# Patient Record
Sex: Male | Born: 1985 | Race: White | Hispanic: No | Marital: Single | Smoking: Never smoker
Health system: Southern US, Community
[De-identification: ages and names within clinical notes are randomized; demographics above are authoritative.]

## PROBLEM LIST (undated history)

## (undated) DIAGNOSIS — Z91018 Allergy to other foods: Secondary | ICD-10-CM

## (undated) DIAGNOSIS — J45909 Unspecified asthma, uncomplicated: Secondary | ICD-10-CM

## (undated) HISTORY — PX: APPENDECTOMY: SHX54

## (undated) HISTORY — DX: Unspecified asthma, uncomplicated: J45.909

## (undated) HISTORY — DX: Allergy to other foods: Z91.018

## (undated) HISTORY — PX: WISDOM TOOTH EXTRACTION: SHX21

---
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
## 2006-01-14 ENCOUNTER — Ambulatory Visit: Payer: Self-pay | Admitting: Psychologist

## 2006-01-21 ENCOUNTER — Ambulatory Visit: Payer: Self-pay | Admitting: Pediatrics

## 2006-02-11 ENCOUNTER — Ambulatory Visit: Payer: Self-pay | Admitting: Psychologist

## 2006-02-17 ENCOUNTER — Ambulatory Visit: Payer: Self-pay | Admitting: Psychologist

## 2006-06-26 ENCOUNTER — Ambulatory Visit: Payer: Self-pay | Admitting: Pediatrics

## 2006-10-19 ENCOUNTER — Ambulatory Visit: Payer: Self-pay | Admitting: Pediatrics

## 2007-02-09 ENCOUNTER — Ambulatory Visit: Payer: Self-pay | Admitting: Pediatrics

## 2007-07-02 ENCOUNTER — Ambulatory Visit: Payer: Self-pay | Admitting: Pediatrics

## 2007-10-19 ENCOUNTER — Ambulatory Visit: Payer: Self-pay | Admitting: Pediatrics

## 2008-02-23 ENCOUNTER — Ambulatory Visit: Payer: Self-pay | Admitting: Pediatrics

## 2008-06-01 ENCOUNTER — Ambulatory Visit: Payer: Self-pay | Admitting: Pediatrics

## 2008-08-17 ENCOUNTER — Ambulatory Visit: Payer: Self-pay | Admitting: Pediatrics

## 2008-11-14 ENCOUNTER — Ambulatory Visit: Payer: Self-pay | Admitting: Pediatrics

## 2009-02-21 ENCOUNTER — Ambulatory Visit: Payer: Self-pay | Admitting: Pediatrics

## 2009-05-24 ENCOUNTER — Ambulatory Visit: Payer: Self-pay | Admitting: Pediatrics

## 2009-07-02 ENCOUNTER — Encounter (INDEPENDENT_AMBULATORY_CARE_PROVIDER_SITE_OTHER): Payer: Self-pay | Admitting: Surgery

## 2009-07-02 ENCOUNTER — Ambulatory Visit (HOSPITAL_COMMUNITY): Admission: EM | Admit: 2009-07-02 | Discharge: 2009-07-02 | Payer: Self-pay | Admitting: Internal Medicine

## 2009-08-16 ENCOUNTER — Ambulatory Visit: Payer: Self-pay | Admitting: Pediatrics

## 2009-11-29 ENCOUNTER — Ambulatory Visit: Payer: Self-pay | Admitting: Pediatrics

## 2010-02-23 IMAGING — CT CT ABDOMEN W/ CM
1 series · 15 of 32 positions shown, 19 images · IV contrast (agent unspecified)
Comparison: None

CT ABDOMEN

CLINICAL DATA: Right lower quadrant pain.  Nausea.  Clinical
suspicion for appendicitis.

CT ABDOMEN AND PELVIS WITH CONTRAST
TECHNIQUE: Multidetector CT imaging of the abdomen and pelvis was
performed using the standard protocol following bolus
administration of intravenous contrast.
Contrast: 100 ml [id number] and oral contrast

[Series 2: rtn ap with st · axial · 0.63mm/px · z∈[-490,-70]mm · 15 of 93 slices shown, 19 images]
[im 6/93  soft-tissue]
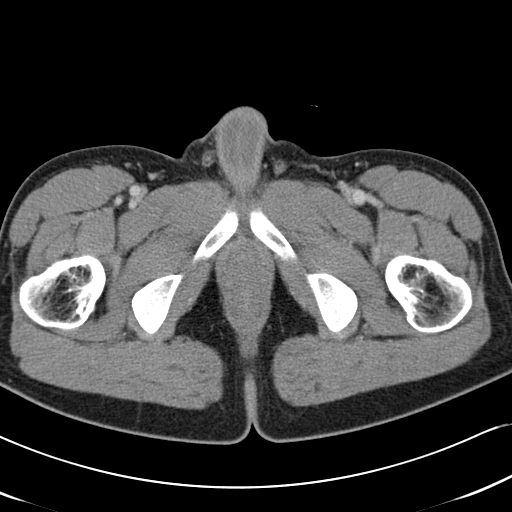
[im 6/93  bone]
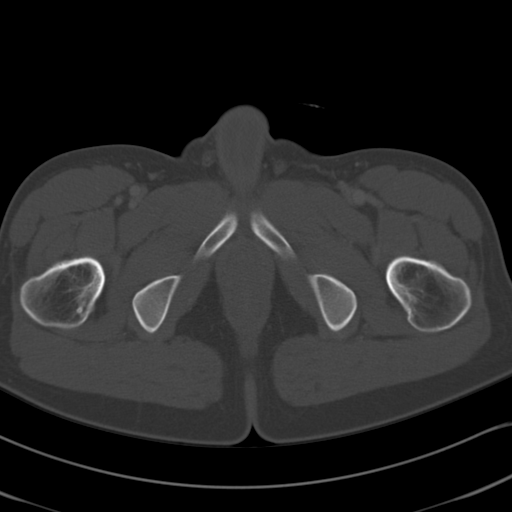
[im 12/93  soft-tissue]
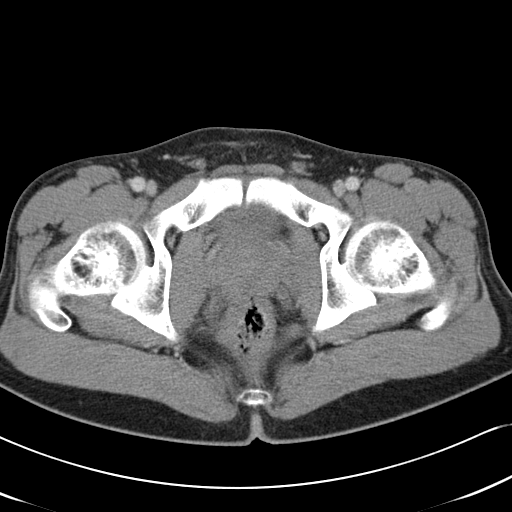
[im 18/93  soft-tissue]
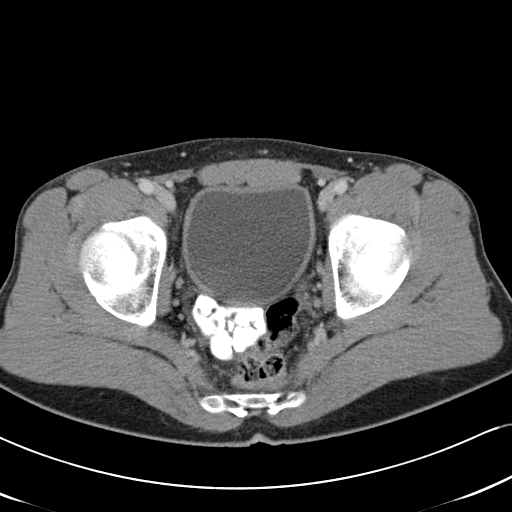
[im 27/93  soft-tissue]
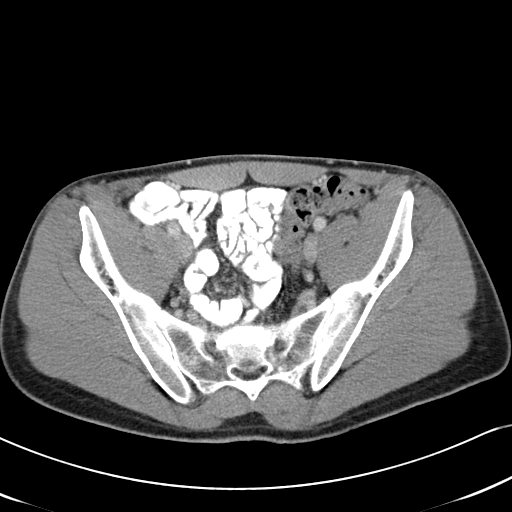
[im 33/93  soft-tissue]
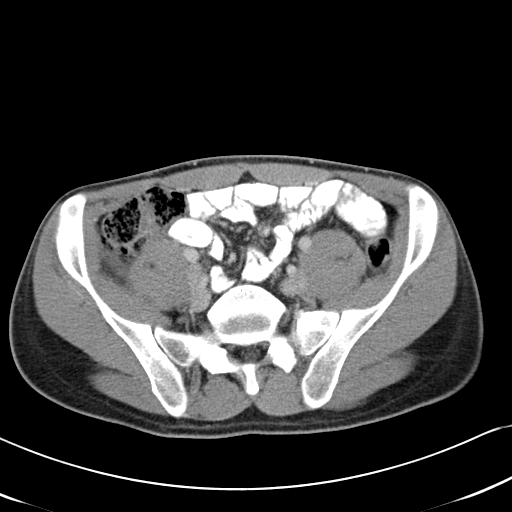
[im 39/93  soft-tissue]
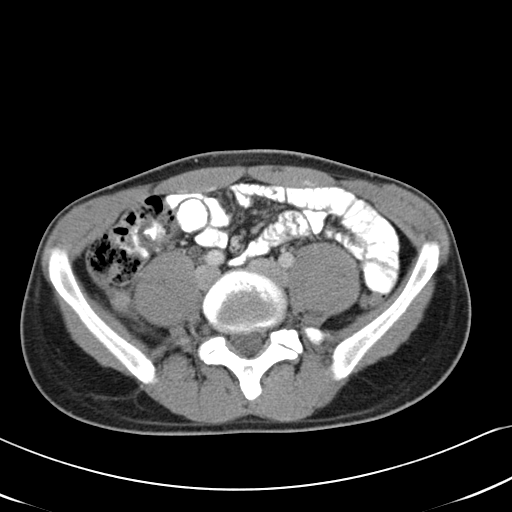
[im 48/93  soft-tissue]
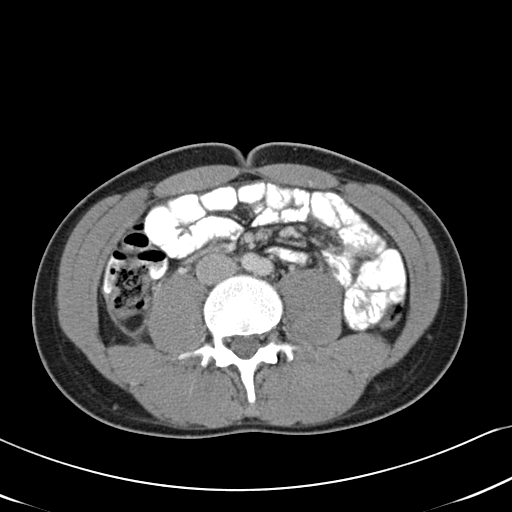
[im 54/93  soft-tissue]
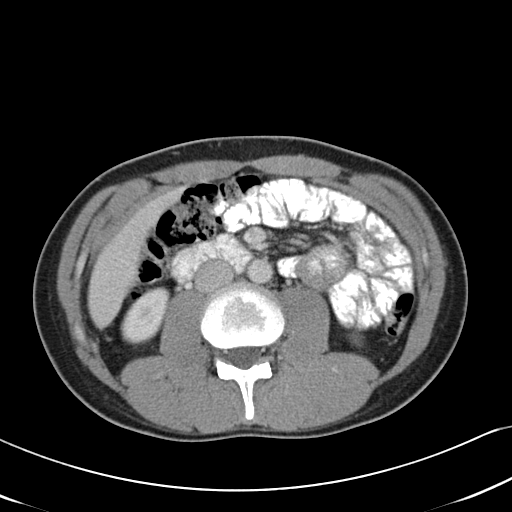
[im 60/93  soft-tissue]
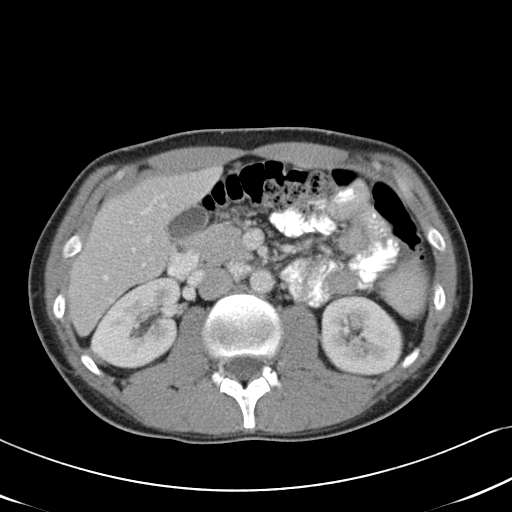
[im 60/93  bone]
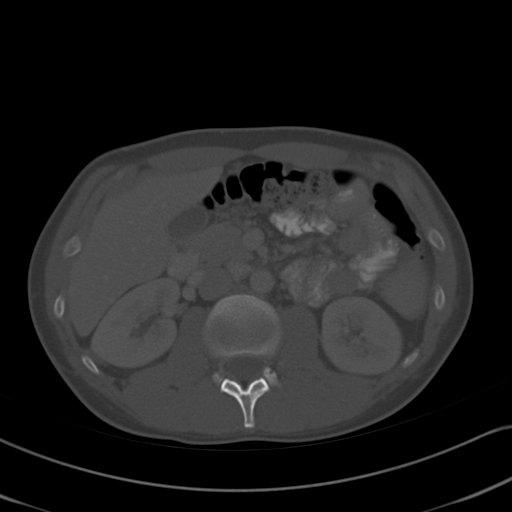
[im 66/93  soft-tissue]
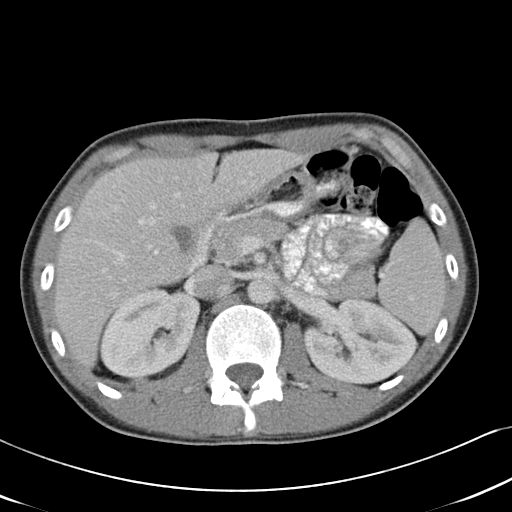
[im 75/93  soft-tissue]
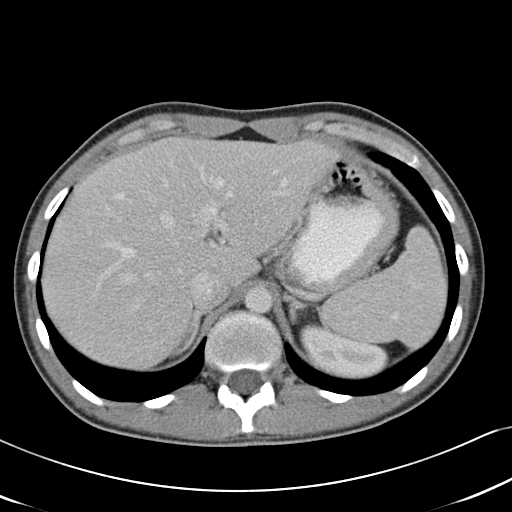
[im 81/93  soft-tissue]
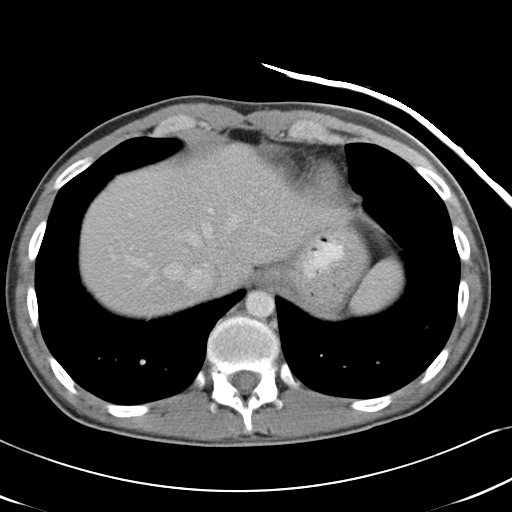
[im 81/93  lung]
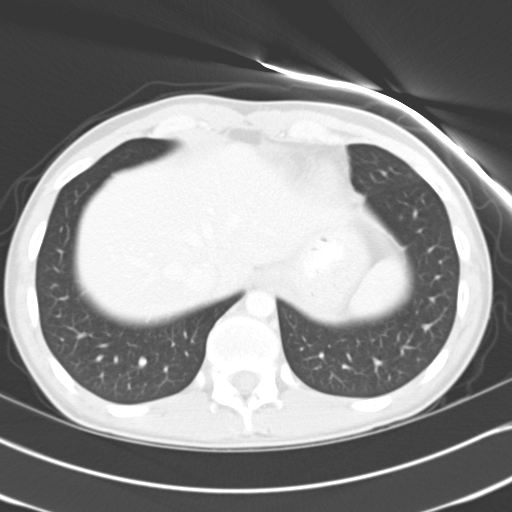
[im 84/93  lung]
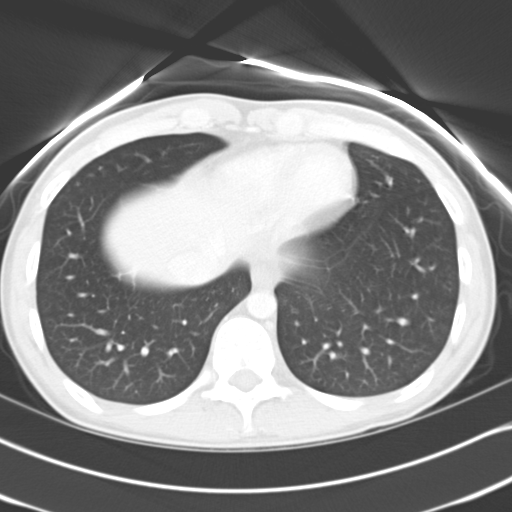
[im 87/93  soft-tissue]
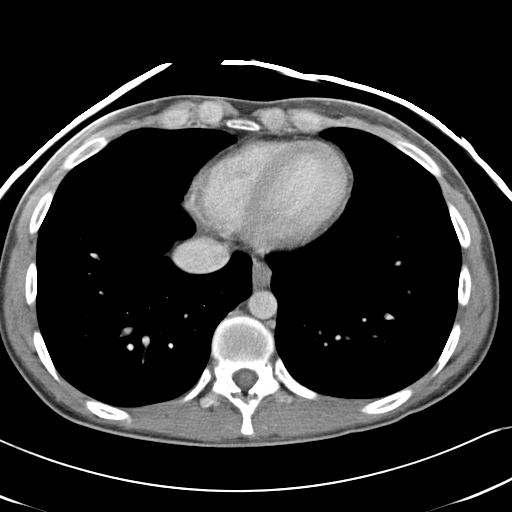
[im 87/93  lung]
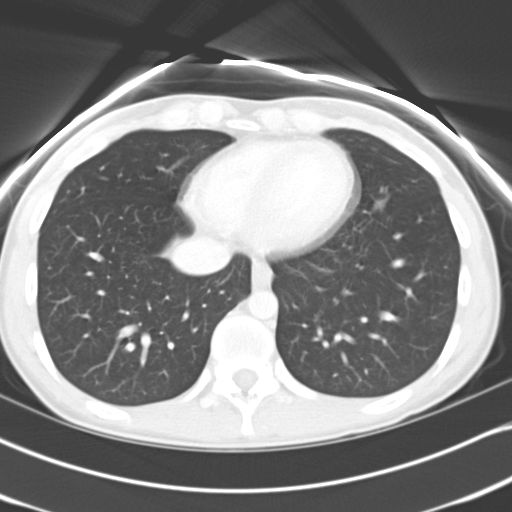
[im 90/93  lung]
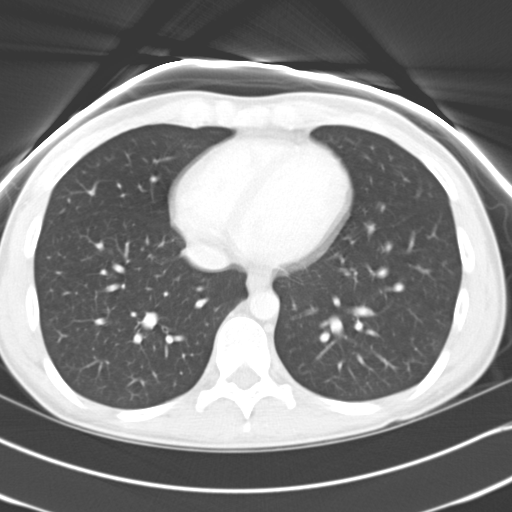

[15 of 32 positions shown; findings below may reference images not displayed]

FINDINGS: The abdominal parenchymal organs are normal in
appearance.  Gallbladder appears normal and there is no evidence of
hydronephrosis.

There is no evidence of soft tissue masses or lymphadenopathy
within the abdomen.  There is no evidence of inflammatory process
or abnormal fluid collections.  No evidence of dilated bowel loops.
IMPRESSION: No acute findings within the abdomen.

CT PELVIS
FINDINGS: The appendix is retrocecal location and shows diffuse
wall thickening and mild periappendiceal inflammatory changes.
This is consistent with acute retrocecal appendicitis.

There is no evidence of abscess or free fluid.  There is no
evidence of pelvic soft tissue masses or lymphadenopathy.
IMPRESSION: Acute retrocecal appendicitis.  No evidence of abscess or other
complication.

This was made a call report for [REDACTED]
technologist.

## 2015-08-28 ENCOUNTER — Other Ambulatory Visit: Payer: Self-pay | Admitting: *Deleted

## 2015-08-28 MED ORDER — MOMETASONE FUROATE 50 MCG/ACT NA SUSP: 2.0000 | Freq: Every day | NASAL | Status: DC

## 2015-08-29 ENCOUNTER — Other Ambulatory Visit: Payer: Self-pay | Admitting: Neurology

## 2015-08-29 ENCOUNTER — Telehealth: Payer: Self-pay | Admitting: Allergy and Immunology

## 2015-08-29 MED ORDER — FLUTICASONE PROPIONATE 50 MCG/ACT NA SUSP: NASAL | Status: DC

## 2015-08-29 NOTE — Telephone Encounter (Signed)
There are some PA's around for pt. He called insurance and they cover Fluticasone. He would like to try that in place of the nasonex.  i put a note on the PA Just documenting, no need to call

## 2015-08-29 NOTE — Telephone Encounter (Signed)
Per Dr Lucie LeatherKozlow sent in prescription for Fluticasone and called and left voicemail for patient.

## 2016-01-18 ENCOUNTER — Other Ambulatory Visit: Payer: Self-pay

## 2016-01-18 MED ORDER — MOMETASONE FUROATE 220 MCG/INH IN AEPB: 2.0000 | INHALATION_SPRAY | Freq: Every day | RESPIRATORY_TRACT | Status: DC

## 2016-02-05 ENCOUNTER — Ambulatory Visit (INDEPENDENT_AMBULATORY_CARE_PROVIDER_SITE_OTHER): Payer: 59 | Admitting: Allergy and Immunology

## 2016-02-05 ENCOUNTER — Encounter: Payer: Self-pay | Admitting: Allergy and Immunology

## 2016-02-05 VITALS — BP 122/62 | HR 76 | Resp 16 | Ht 71.0 in | Wt 140.0 lb

## 2016-02-05 DIAGNOSIS — Z91018 Allergy to other foods: Secondary | ICD-10-CM | POA: Diagnosis not present

## 2016-02-05 DIAGNOSIS — J309 Allergic rhinitis, unspecified: Secondary | ICD-10-CM

## 2016-02-05 DIAGNOSIS — H1010 Acute atopic conjunctivitis, unspecified eye: Secondary | ICD-10-CM

## 2016-02-05 DIAGNOSIS — J4530 Mild persistent asthma, uncomplicated: Secondary | ICD-10-CM | POA: Diagnosis not present

## 2016-02-05 MED ORDER — AZELASTINE HCL 0.05 % OP SOLN: 1.0000 [drp] | Freq: Two times a day (BID) | OPHTHALMIC | Status: DC

## 2016-02-05 MED ORDER — ALBUTEROL SULFATE HFA 108 (90 BASE) MCG/ACT IN AERS: 2.0000 | INHALATION_SPRAY | RESPIRATORY_TRACT | Status: DC | PRN

## 2016-02-05 MED ORDER — EPINEPHRINE 0.3 MG/0.3ML IJ SOAJ: 0.3000 mg | Freq: Once | INTRAMUSCULAR | Status: DC

## 2016-02-05 MED ORDER — FLUTICASONE PROPIONATE 50 MCG/ACT NA SUSP: NASAL | Status: DC

## 2016-02-05 MED ORDER — MOMETASONE FUROATE 220 MCG/INH IN AEPB: 2.0000 | INHALATION_SPRAY | Freq: Every day | RESPIRATORY_TRACT | Status: DC

## 2016-02-05 NOTE — Progress Notes (Signed)
Follow-up Note  Referring Provider: No ref. provider found Primary Provider: No PCP Per Patient Date of Office Visit: 02/05/2016  Subjective:   John Cannon (DOB: 1986-06-12) is a [age] y.o. male [facility] in re-evaluation of the following:  HPI: John MarshallJimmy returns to this clinic in reevaluation of his asthma and allergic rhinoconjunctivitis and food allergy. He is done quite well since His last visit in this clinic 1 year ago and has had no exacerbations of his asthma requiring a systemic steroid while consistently using his Asmanex. Rarely does he uses a short acting bronchodilator and he can exercise without any difficulty. He's had very little problems with his nose as well while using nasal fluticasone. He does need to use an occasional eyedrop and he does consistently use Zyrtec. He remains away from carrots and celery and apples and snap peas.    Medication List           CENTRUM ADULTS PO  Take 1 tablet by mouth daily.     cetirizine 10 MG tablet  Commonly known as:  ZYRTEC  Take 10 mg by mouth daily.     fluticasone 50 MCG/ACT nasal spray  Commonly known as:  FLONASE  Use 1-2 sprays in each nostril once daily .     METADATE CD 40 MG CR capsule  Generic drug:  methylphenidate  Take 80 mg by mouth daily.     mometasone 220 MCG/INH inhaler  Commonly known as:  ASMANEX 14 METERED DOSES  Inhale 2 puffs into the lungs daily.     VENTOLIN HFA 108 (90 Base) MCG/ACT inhaler  Generic drug:  albuterol  Inhale 2 puffs into the lungs every 4 (four) hours as needed for wheezing or shortness of breath.        Past Medical History  Diagnosis Date  . Asthma     Past Surgical History  Procedure Laterality Date  . Appendectomy    . Wisdom tooth extraction      Allergies  Allergen Reactions  . Benadryl [Diphenhydramine Hcl (Sleep)]     Hyperactivity   . Ceftin [Cefuroxime] Itching    Review of systems negative except  as noted in HPI / PMHx or noted below:  Review of Systems  Constitutional: Negative.   HENT: Negative.   Eyes: Negative.   Respiratory: Negative.   Cardiovascular: Negative.   Gastrointestinal: Negative.   Genitourinary: Negative.   Musculoskeletal: Negative.   Skin: Negative.   Neurological: Negative.   Endo/Heme/Allergies: Negative.   Psychiatric/Behavioral: Negative.      Objective:   Filed Vitals:   02/05/16 1723  BP: 122/62  Pulse: 76  Resp: 16   Height: 5\' 11"  (180.3 cm)  Weight: 140 lb (63.504 kg)   Physical Exam  Constitutional: He is well-developed, well-nourished, and in no distress.  HENT:  Head: Normocephalic.  Right Ear: Tympanic membrane, external ear and ear canal normal.  Left Ear: Tympanic membrane, external ear and ear canal normal.  Nose: Nose normal. No mucosal edema or rhinorrhea.  Mouth/Throat: Uvula is midline, oropharynx is clear and moist and mucous membranes are normal. No oropharyngeal exudate.  Eyes: Conjunctivae are normal.  Neck: Trachea normal. No tracheal tenderness present. No tracheal deviation present. No thyromegaly present.  Cardiovascular: Normal rate, regular rhythm, S1 normal, S2 normal and normal heart sounds.   No murmur heard. Pulmonary/Chest: Breath sounds normal. No stridor. No respiratory distress. He has no wheezes. He has  no rales.  Musculoskeletal: He exhibits no edema.  Lymphadenopathy:       Head (right side): No tonsillar adenopathy present.       Head (left side): No tonsillar adenopathy present.    He has no cervical adenopathy.  Neurological: He is alert. Gait normal.  Skin: No rash noted. He is not diaphoretic. No erythema. Nails show no clubbing.  Psychiatric: Mood and affect normal.    Diagnostics:    Spirometry was performed and demonstrated an FEV1 of 4.91 at 106 % of predicted.  The patient had an Asthma Control Test with the following results:  .    Assessment and Plan:   1. Asthma, well  controlled, mild persistent   2. Allergic rhinoconjunctivitis   3. Food allergy     1. Continue Asmanex 220 one inhalation 1 time per day  2. Continue nasal fluticasone one-2 sprays each nostril one time per day  3. Continue ProAir HFA 2 puffs every 4-6 hours if needed  4. Continue Azelastine eyedrop one drop each eye twice a day if needed  5. Continue Zyrtec 10 mg one tablet one time per day if needed  6. Auvi-Q 3.0 epinephrine autoinjector if needed. Specialty pharmacy  7. Return to clinic in 1 year or earlier if needed. Obtain fall flu vaccine  John Cannon has done quite well on his current medical therapy and I will continue to have him use nasal steroids and inhaled steroids to treat his atopic respiratory disease. He will use a Auvi-Q device if needed and we'll see him back in this clinic in 1 year or earlier if there is a problem.  Laurette Schimke, MD [facility]

## 2016-02-05 NOTE — Patient Instructions (Addendum)
  1. Continue Asmanex 220 one inhalation 1 time per day  2. Continue nasal fluticasone one-2 sprays each nostril one time per day  3. Continue ProAir HFA 2 puffs every 4-6 hours if needed  4. Continue Azelastine eyedrop one drop each eye twice a day if needed  5. Continue Zyrtec 10 mg one tablet one time per day if needed  6. Auvi-Q 3.0 epinephrine autoinjector if needed. Specialty pharmacy  7. Return to clinic in 1 year or earlier if needed. Obtain fall flu vaccine

## 2016-10-19 ENCOUNTER — Other Ambulatory Visit: Payer: Self-pay | Admitting: Allergy and Immunology

## 2016-10-20 ENCOUNTER — Other Ambulatory Visit: Payer: Self-pay | Admitting: Allergy and Immunology

## 2016-10-20 MED ORDER — FLUTICASONE PROPIONATE 50 MCG/ACT NA SUSP: NASAL | 2 refills | Status: DC

## 2016-10-20 NOTE — Telephone Encounter (Signed)
Patient wanted to know if you received a fax for a medication refill for the generic for Nasonex. They pharmacy told him they sent it to us.

## 2016-10-20 NOTE — Telephone Encounter (Signed)
Patient was requesting fluticasone nasal spray, not nasonex.

## 2017-01-01 ENCOUNTER — Telehealth: Payer: Self-pay | Admitting: Allergy and Immunology

## 2017-01-01 NOTE — Telephone Encounter (Signed)
Flovent and Pulmicort are the only options covered on his plan at this time.

## 2017-01-01 NOTE — Telephone Encounter (Signed)
Pt called and said pharmacy was suppose to send something to [place], but we have not received it so I called pt and he needs to have something else that take the place of asmanex because they requre a auth. And ins will not pay  [facility] in [place]. [phone].

## 2017-01-01 NOTE — Telephone Encounter (Signed)
What are his options?

## 2017-01-02 MED ORDER — BUDESONIDE 180 MCG/ACT IN AEPB: 2.0000 | INHALATION_SPRAY | Freq: Two times a day (BID) | RESPIRATORY_TRACT | 3 refills | Status: DC

## 2017-01-02 NOTE — Telephone Encounter (Signed)
Patient has been advised of medication being sent in.

## 2017-01-02 NOTE — Telephone Encounter (Signed)
Patient called again about this same message. He said he really needs something today because he is out of medication. He said the fastest option would be best.

## 2017-01-02 NOTE — Telephone Encounter (Signed)
Pulmicort 180 2 inhalations 1-2 times per day (2 should be fine but write twice a day) Inform patient.

## 2017-10-07 ENCOUNTER — Telehealth: Payer: Self-pay

## 2017-10-07 NOTE — Telephone Encounter (Signed)
Completed.

## 2017-10-13 ENCOUNTER — Telehealth: Payer: Self-pay | Admitting: Allergy and Immunology

## 2017-10-13 MED ORDER — BUDESONIDE 180 MCG/ACT IN AEPB: 2.0000 | INHALATION_SPRAY | Freq: Two times a day (BID) | RESPIRATORY_TRACT | 0 refills | Status: DC

## 2017-10-13 NOTE — Telephone Encounter (Signed)
Patient is requesting a refill for Pulmicort. He was told he needed an appt and he has one scheduled for 11-11-17. He said his insurance has changed and they will not pay for this. But he is requesting it.

## 2017-10-13 NOTE — Telephone Encounter (Signed)
Prescription sent

## 2017-10-13 NOTE — Telephone Encounter (Signed)
Pharmacy is [facility] in [place], on [address].

## 2017-10-15 NOTE — Telephone Encounter (Signed)
Patient's insurance will not cover the Pulmicort. The alternative is Qvar. Please advise and thank you.

## 2017-10-15 NOTE — Telephone Encounter (Signed)
So this is the message I received this week: "Flovent and Pulmicort are the only options covered on his plan at this time."  So now Pulmicort is not covered and Qvar is? ARE WE SURE ABOUT THIS???????  If Qvar, use Qvar 80 Redihaler 2 inhalations one time per day.

## 2017-10-15 NOTE — Telephone Encounter (Signed)
Please advise and thank you. 

## 2017-10-15 NOTE — Telephone Encounter (Signed)
Patient called back checking on this. I told him Pulmicort had been sent to pharmacy. He said his insurance won't pay for this and he needs an equivalent to this that [employer] will cover.

## 2017-10-15 NOTE — Telephone Encounter (Signed)
Can you please check on this?

## 2017-10-16 MED ORDER — BECLOMETHASONE DIPROP HFA 80 MCG/ACT IN AERB: 2.0000 | INHALATION_SPRAY | Freq: Every day | RESPIRATORY_TRACT | 5 refills | Status: DC

## 2017-10-16 NOTE — Addendum Note (Signed)
Addended by: Mliss FritzBLACK, Ryver Poblete I on: 10/16/2017 03:44 PM   Modules accepted: Orders

## 2017-10-16 NOTE — Telephone Encounter (Signed)
Prescription has been sent to pharmacy as instructed by Dr. Lucie LeatherKozlow. I did try to contact patient to notify him of this but he did not answer and did not have a voicemail set up.

## 2017-10-16 NOTE — Telephone Encounter (Signed)
I spoke with the pharmacist and he advised that the Qvar is preferred with this patient's insurance

## 2017-11-11 ENCOUNTER — Ambulatory Visit (INDEPENDENT_AMBULATORY_CARE_PROVIDER_SITE_OTHER): Payer: Managed Care, Other (non HMO) | Admitting: Allergy and Immunology

## 2017-11-11 ENCOUNTER — Encounter: Payer: Self-pay | Admitting: Allergy and Immunology

## 2017-11-11 VITALS — BP 118/68 | HR 72 | Resp 20

## 2017-11-11 DIAGNOSIS — Z91018 Allergy to other foods: Secondary | ICD-10-CM

## 2017-11-11 DIAGNOSIS — J4530 Mild persistent asthma, uncomplicated: Secondary | ICD-10-CM | POA: Diagnosis not present

## 2017-11-11 DIAGNOSIS — J3089 Other allergic rhinitis: Secondary | ICD-10-CM | POA: Diagnosis not present

## 2017-11-11 MED ORDER — AUVI-Q 0.3 MG/0.3ML IJ SOAJ: INTRAMUSCULAR | 2 refills | Status: DC

## 2017-11-11 MED ORDER — AZELASTINE HCL 0.05 % OP SOLN: OPHTHALMIC | 3 refills | Status: DC

## 2017-11-11 NOTE — Progress Notes (Signed)
Follow-up Note  Referring Provider: Kathi DerSiddiqui, Adeel M, MD Primary Provider: Kathi DerSiddiqui, Adeel M, MD Date of Office Visit: 11/11/2017  Subjective:   John Cannon (DOB: [birth year]) is a 32 y.o. male who [facility] in re-evaluation of the following:  HPI: John Cannon returns to this clinic in reevaluation of asthma and allergic rhinitis and food allergy.  His last visit to this clinic was 05 Feb 2016.  He has had an excellent year without the need for systemic steroid or antibiotic although in January he did have an upper respiratory tract infection for which he was given doxycycline which resulted in significant GI upset for which he is now using Prilosec.  Fortunately, most of his GI issue has resolved.  John Cannon can exercise without any difficulty and rarely uses a short acting bronchodilator while consistently using Qvar.  Presently he is using an extremely low dose at only 1 inhalation once a day.  His nose is doing very well while using a nasal steroid.  He remains away from consumption of carrots and celery and apples and snap peas and bananas.  He did obtain the flu vaccine this year.  Allergies as of 11/11/2017      Reactions   Benadryl [diphenhydramine Hcl (sleep)]    Hyperactivity   Ceftin [cefuroxime] Itching   Doxycycline Other (See Comments)   Stomach issues " undiagnosed stomach ulcer"      Medication List      albuterol 108 (90 Base) MCG/ACT inhaler Commonly known as:  VENTOLIN HFA Inhale 2 puffs into the lungs every 4 (four) hours as needed for wheezing or shortness of breath.   azelastine 0.05 % ophthalmic solution Commonly known as:  OPTIVAR Place 1 drop into both eyes 2 (two) times daily.   beclomethasone 80 MCG/ACT inhaler Commonly known as:  QVAR REDIHALER Inhale 2 puffs into the lungs daily.   CENTRUM ADULTS PO Take 1 tablet by mouth daily.   cetirizine 10 MG tablet Commonly known as:  ZYRTEC Take 10 mg by  mouth daily.   EPINEPHrine 0.3 mg/0.3 mL Soaj injection Commonly known as:  AUVI-Q Inject 0.3 mLs (0.3 mg total) into the muscle once.   fluticasone 50 MCG/ACT nasal spray Commonly known as:  FLONASE Use one-two sprays in each nostril once daily as directed   METADATE CD 40 MG CR capsule Generic drug:  methylphenidate Take 80 mg by mouth daily.       Past Medical History:  Diagnosis Date  . Asthma   . Food allergy    Banana, snap peas, carrots, celery, apples    Past Surgical History:  Procedure Laterality Date  . APPENDECTOMY    . WISDOM TOOTH EXTRACTION      Review of systems negative except as noted in HPI / PMHx or noted below:  Review of Systems  Constitutional: Negative.   HENT: Negative.   Eyes: Negative.   Respiratory: Negative.   Cardiovascular: Negative.   Gastrointestinal: Negative.   Genitourinary: Negative.   Musculoskeletal: Negative.   Skin: Negative.   Neurological: Negative.   Endo/Heme/Allergies: Negative.   Psychiatric/Behavioral: Negative.      Objective:   Vitals:   11/11/17 1040  BP: 118/68  Pulse: 72  Resp: 20          Physical Exam  Constitutional: He is well-developed, well-nourished, and in no distress.  HENT:  Head: Normocephalic.  Right Ear: Tympanic membrane, external ear and ear canal normal.  Left Ear: Tympanic membrane, external ear and ear canal normal.  Nose: Nose normal. No mucosal edema or rhinorrhea.  Mouth/Throat: Uvula is midline, oropharynx is clear and moist and mucous membranes are normal. No oropharyngeal exudate.  Eyes: Conjunctivae are normal.  Neck: Trachea normal. No tracheal tenderness present. No tracheal deviation present. No thyromegaly present.  Cardiovascular: Normal rate, regular rhythm, S1 normal, S2 normal and normal heart sounds.  No murmur heard. Pulmonary/Chest: Breath sounds normal. No stridor. No respiratory distress. He has no wheezes. He has no rales.  Musculoskeletal: He exhibits no  edema.  Lymphadenopathy:       Head (right side): No tonsillar adenopathy present.       Head (left side): No tonsillar adenopathy present.    He has no cervical adenopathy.  Neurological: He is alert. Gait normal.  Skin: No rash noted. He is not diaphoretic. No erythema. Nails show no clubbing.  Psychiatric: Mood and affect normal.    Diagnostics:    Spirometry was performed and demonstrated an FEV1 of 4.86 at 106 % of predicted.  The patient had an Asthma Control Test with the following results: ACT Total Score: 24.    Assessment and Plan:   1. Asthma, well controlled, mild persistent   2. Other allergic rhinitis   3. Food allergy     1. Continue Qvar 80 redihaler one inhalation 1 time per day. Can increase to 2 inhalations two times per day if 'flare up'  2. Continue nasal fluticasone one-2 sprays each nostril one time per day  3. Continue ProAir HFA 2 puffs every 4-6 hours if needed  4. Continue Azelastine eyedrop one drop each eye twice a day if needed   5. Continue Zyrtec 10 mg one tablet one time per day if needed  6. Auvi-Q 3.0 epinephrine autoinjector if needed. Specialty pharmacy  7. Return to clinic in 1 year or earlier if needed.    John Marshall appears to be doing very well at this point in time and I will see him back in this clinic in a 1 year or earlier if there is a problem.  Laurette Schimke, MD Allergy / Immunology [facility]

## 2017-11-11 NOTE — Patient Instructions (Addendum)
  1. Continue Qvar 80 redihaler one inhalation 1 time per day. Can increase to 2 inhalations two times per day if 'flare up'  2. Continue nasal fluticasone one-2 sprays each nostril one time per day  3. Continue ProAir HFA 2 puffs every 4-6 hours if needed  4. Continue Azelastine eyedrop one drop each eye twice a day if needed  5. Continue Zyrtec 10 mg one tablet one time per day if needed  6. Auvi-Q 3.0 epinephrine autoinjector if needed. Specialty pharmacy  7. Return to clinic in 1 year or earlier if needed.   

## 2017-11-12 ENCOUNTER — Encounter: Payer: Self-pay | Admitting: Allergy and Immunology

## 2018-09-06 ENCOUNTER — Telehealth: Payer: Self-pay | Admitting: Allergy and Immunology

## 2018-09-06 MED ORDER — ALBUTEROL SULFATE HFA 108 (90 BASE) MCG/ACT IN AERS: 2.0000 | INHALATION_SPRAY | Freq: Four times a day (QID) | RESPIRATORY_TRACT | 2 refills | Status: DC | PRN

## 2018-09-06 MED ORDER — ALBUTEROL SULFATE HFA 108 (90 BASE) MCG/ACT IN AERS: 2.0000 | INHALATION_SPRAY | RESPIRATORY_TRACT | 1 refills | Status: DC | PRN

## 2018-09-06 NOTE — Telephone Encounter (Signed)
Pt called and needs to have the albuterol inhaler called into [facility] on [address] in [place]. [phone].

## 2018-09-06 NOTE — Telephone Encounter (Signed)
Pt called and said his [employer] ins is not going to cover the inhaler and wanted to see if you could call in something that his ins will cover.

## 2018-09-06 NOTE — Telephone Encounter (Signed)
Resent albuterol for either Proventil or Ventolin

## 2018-09-06 NOTE — Telephone Encounter (Signed)
Refill has been sent in to the requested pharmacy 

## 2018-11-24 ENCOUNTER — Other Ambulatory Visit: Payer: Self-pay | Admitting: Allergy and Immunology

## 2018-11-30 ENCOUNTER — Telehealth: Payer: Self-pay

## 2018-11-30 MED ORDER — AUVI-Q 0.3 MG/0.3ML IJ SOAJ: INTRAMUSCULAR | 2 refills | Status: DC

## 2018-11-30 MED ORDER — AZELASTINE HCL 0.05 % OP SOLN: OPHTHALMIC | 2 refills | Status: DC

## 2018-11-30 MED ORDER — FLUTICASONE PROPIONATE 50 MCG/ACT NA SUSP: NASAL | 2 refills | Status: DC

## 2018-11-30 MED ORDER — BECLOMETHASONE DIPROP HFA 80 MCG/ACT IN AERB: INHALATION_SPRAY | RESPIRATORY_TRACT | 2 refills | Status: DC

## 2018-11-30 MED ORDER — ALBUTEROL SULFATE HFA 108 (90 BASE) MCG/ACT IN AERS: 2.0000 | INHALATION_SPRAY | Freq: Four times a day (QID) | RESPIRATORY_TRACT | 1 refills | Status: DC | PRN

## 2018-11-30 NOTE — Telephone Encounter (Signed)
Medications have been sent in to get the patient to her appointment in May. Called and informed patient, pt verbalized understanding.

## 2018-11-30 NOTE — Telephone Encounter (Signed)
Patient has r/s his appt with Dr Lucie Leather and was wondering if we can send refills in on all his meds to [facility] on [address] [place]  [place] [address]

## 2018-12-07 ENCOUNTER — Ambulatory Visit: Payer: Managed Care, Other (non HMO) | Admitting: Allergy and Immunology

## 2019-01-11 ENCOUNTER — Ambulatory Visit (INDEPENDENT_AMBULATORY_CARE_PROVIDER_SITE_OTHER): Payer: Managed Care, Other (non HMO) | Admitting: Allergy and Immunology

## 2019-01-11 ENCOUNTER — Encounter: Payer: Self-pay | Admitting: Allergy and Immunology

## 2019-01-11 ENCOUNTER — Other Ambulatory Visit: Payer: Self-pay

## 2019-01-11 DIAGNOSIS — Z91018 Allergy to other foods: Secondary | ICD-10-CM | POA: Diagnosis not present

## 2019-01-11 DIAGNOSIS — J3089 Other allergic rhinitis: Secondary | ICD-10-CM

## 2019-01-11 DIAGNOSIS — J4530 Mild persistent asthma, uncomplicated: Secondary | ICD-10-CM | POA: Diagnosis not present

## 2019-01-11 MED ORDER — FLUTICASONE PROPIONATE 50 MCG/ACT NA SUSP: NASAL | 5 refills | Status: DC

## 2019-01-11 MED ORDER — CETIRIZINE HCL 10 MG PO TABS: 10.0000 mg | ORAL_TABLET | Freq: Every day | ORAL | 5 refills | Status: DC

## 2019-01-11 MED ORDER — ALBUTEROL SULFATE HFA 108 (90 BASE) MCG/ACT IN AERS: 2.0000 | INHALATION_SPRAY | Freq: Four times a day (QID) | RESPIRATORY_TRACT | 1 refills | Status: DC | PRN

## 2019-01-11 MED ORDER — BECLOMETHASONE DIPROP HFA 80 MCG/ACT IN AERB: INHALATION_SPRAY | RESPIRATORY_TRACT | 5 refills | Status: DC

## 2019-01-11 MED ORDER — AUVI-Q 0.3 MG/0.3ML IJ SOAJ: INTRAMUSCULAR | 2 refills | Status: DC

## 2019-01-11 MED ORDER — AZELASTINE HCL 0.05 % OP SOLN: OPHTHALMIC | 5 refills | Status: DC

## 2019-01-11 NOTE — Progress Notes (Signed)
Patient is under quarantine at [employer] in [place], [place]. Provider is in office. Verbal Consent given. Start Time: 1002 am

## 2019-01-11 NOTE — Patient Instructions (Signed)
  1. Continue Qvar 80 redihaler one inhalation 1 time per day. Can increase to 2 inhalations two times per day if 'flare up'  2. Continue nasal fluticasone one-2 sprays each nostril one time per day  3. Continue ProAir HFA 2 puffs every 4-6 hours if needed  4. Continue Azelastine eyedrop one drop each eye twice a day if needed  5. Continue Zyrtec 10 mg one tablet one time per day if needed  6. Auvi-Q 3.0 epinephrine autoinjector if needed. Specialty pharmacy  7. Return to clinic in 1 year or earlier if needed.

## 2019-01-11 NOTE — Progress Notes (Signed)
[place] [facility] [place] [facility] [place] [facility] [place] - [place]   Follow-up Note  Referring Provider: Kathi Der, MD Primary Provider: Kathi Der, MD Date of Office Visit: 01/11/2019  Subjective:   John Cannon (DOB: 05/25/1986) is a 33 y.o. male who [facility] 01[facility]2019 in re-evaluation of the following:  HPI: This is an E-med visit requested by patient who is located at home.  John Cannon is followed in this clinic for asthma and allergic rhinitis and food allergy.  His last visit to this clinic was 11 November 2017.  No systemic steroid use or antibiotics use other than a Urgent Care evaluation for a URTI treated with Z pack in December 2020. Otherwise, doing great, rare use of SABA, can exercise without difficulty while using Qvar at a dose of 1 inhalation daily.  Nose OK even through the spring while using flonase.  Avoiding raw vegetables and fruits. Has Auvi-Q.   Did receive flu vaccine.  Allergies as of 01/11/2019      Reactions   Benadryl [diphenhydramine Hcl (sleep)]    Hyperactivity   Ceftin [cefuroxime] Itching   Doxycycline Other (See Comments)   Stomach issues " undiagnosed stomach ulcer"      Medication List      albuterol 108 (90 Base) MCG/ACT inhaler Commonly known as:  VENTOLIN HFA Inhale 2 puffs into the lungs every 6 (six) hours as needed for wheezing or shortness of breath.   Auvi-Q 0.3 mg/0.3 mL Soaj injection Generic drug:  EPINEPHrine Use as directed for life-threatening allergic reaction.   azelastine 0.05 % ophthalmic solution Commonly known as:  OPTIVAR Can use one drop in each eye twice daily if needed.   beclomethasone 80 MCG/ACT inhaler Commonly known as:  Qvar RediHaler INHALE 2 PUFFS BY MOUTH DAILY   CENTRUM ADULTS PO Take 1 tablet by mouth daily.   cetirizine 10 MG tablet Commonly known as:  ZYRTEC Take 10 mg by mouth daily.   fluticasone 50 MCG/ACT nasal spray Commonly known as:  FLONASE  Use one-two sprays in each nostril once daily as directed   Metadate CD 40 MG CR capsule Generic drug:  methylphenidate Take 80 mg by mouth daily.       Past Medical History:  Diagnosis Date  . Asthma   . Food allergy    Banana, snap peas, carrots, celery, apples    Past Surgical History:  Procedure Laterality Date  . APPENDECTOMY    . WISDOM TOOTH EXTRACTION      Review of systems negative except as noted in HPI / PMHx or noted below:  Review of Systems  Constitutional: Negative.   HENT: Negative.   Eyes: Negative.   Respiratory: Negative.   Cardiovascular: Negative.   Gastrointestinal: Negative.   Genitourinary: Negative.   Musculoskeletal: Negative.   Skin: Negative.   Neurological: Negative.   Endo/Heme/Allergies: Negative.   Psychiatric/Behavioral: Negative.      Objective:   There were no vitals filed for this visit.        Physical Exam-deferred  Diagnostics: none  Assessment and Plan:   1. Asthma, well controlled, mild persistent   2. Other allergic rhinitis   3. Food allergy     1. Continue Qvar 80 redihaler one inhalation 1 time per day. Can increase to 2 inhalations two times per day if 'flare up'  2. Continue nasal fluticasone one-2 sprays each nostril one time per day  3. Continue ProAir HFA 2 puffs  every 4-6 hours if needed  4. Continue Azelastine eyedrop one drop each eye twice a day if needed  5. Continue Zyrtec 10 mg one tablet one time per day if needed  6. Auvi-Q 3.0 epinephrine autoinjector if needed. Specialty pharmacy  7. Return to clinic in 1 year or earlier if needed.    John Cannon has had another excellent year on his current medical therapy.  He is very knowledgeable about his disease state and how his medications work and appropriate dosing of his medications.  We will refill all of his medications and I will see him back in this clinic in 1 year or earlier if there is a problem..  Total patient interaction time 15 minutes   Laurette SchimkeEric Jassica Zazueta, MD Allergy / Immunology [facility]

## 2019-01-12 ENCOUNTER — Encounter: Payer: Self-pay | Admitting: Allergy and Immunology

## 2019-01-12 NOTE — Progress Notes (Signed)
Patient is at home. Provider is in office.  Verbal Consent given.  Start Time: 1002 am End Time: 1127 am

## 2019-11-22 ENCOUNTER — Telehealth: Payer: Self-pay | Admitting: Allergy and Immunology

## 2019-11-22 MED ORDER — AZELASTINE HCL 0.05 % OP SOLN: OPHTHALMIC | 0 refills | Status: DC

## 2019-11-22 MED ORDER — QVAR REDIHALER 80 MCG/ACT IN AERB: INHALATION_SPRAY | RESPIRATORY_TRACT | 0 refills | Status: DC

## 2019-11-22 MED ORDER — ALBUTEROL SULFATE HFA 108 (90 BASE) MCG/ACT IN AERS: 2.0000 | INHALATION_SPRAY | Freq: Four times a day (QID) | RESPIRATORY_TRACT | 0 refills | Status: DC | PRN

## 2019-11-22 NOTE — Telephone Encounter (Signed)
Patient called and is in the process changing jobs and his ins will end this month and started back in May. I may him appointment for May 4 but he needs refill now optivar, albuterol, and needs a 3 month suppy of Qvar because that is the only way his ins will pay. [facility] [place] [phone]

## 2019-11-22 NOTE — Telephone Encounter (Signed)
Call to patient to make him aware that prescriptions were called in.  Pt verbalized understanding, call ended.

## 2020-01-10 ENCOUNTER — Ambulatory Visit: Payer: Managed Care, Other (non HMO) | Admitting: Allergy and Immunology

## 2020-01-10 ENCOUNTER — Other Ambulatory Visit: Payer: Self-pay

## 2020-01-10 ENCOUNTER — Encounter: Payer: Self-pay | Admitting: Allergy and Immunology

## 2020-01-10 VITALS — BP 110/62 | HR 69 | Temp 98.0°F | Resp 16 | Ht 71.0 in | Wt 147.2 lb

## 2020-01-10 DIAGNOSIS — J4530 Mild persistent asthma, uncomplicated: Secondary | ICD-10-CM

## 2020-01-10 DIAGNOSIS — Z91018 Allergy to other foods: Secondary | ICD-10-CM

## 2020-01-10 DIAGNOSIS — J3089 Other allergic rhinitis: Secondary | ICD-10-CM | POA: Diagnosis not present

## 2020-01-10 MED ORDER — AUVI-Q 0.3 MG/0.3ML IJ SOAJ: INTRAMUSCULAR | 1 refills | Status: DC

## 2020-01-10 NOTE — Progress Notes (Signed)
[place] - [place] - [place] - [place] - [place]   Follow-up Note  Referring Provider: Kathi Der, MD Primary Provider: Kathi Der, MD Date of Office Visit: 01/10/2020  Subjective:   John Cannon (DOB: Jan 20, 1986) is a 34 y.o. male who [facility] in re-evaluation of the following:  HPI: John Cannon returns to this clinic in reevaluation of asthma and allergic rhinitis and food allergy directed against various fruits and vegetables.  His last contact with this clinic was via a E-med visit on 11 Jan 2019.  He is really doing well from an asthmatic standpoint and has not had activate his action plan and rarely uses a short acting bronchodilator where he continues on a very low dose of Qvar.  He can exercise without any problem.  His nose has not really been causing him any issues while he continues to use Flonase.  His oral allergy syndrome and food allergy appears to be inactive as long as he remains away from raw fruits and vegetables.  Allergies as of 01/10/2020      Reactions   Benadryl [diphenhydramine Hcl (sleep)]    Hyperactivity   Ceftin [cefuroxime] Itching   Doxycycline Other (See Comments)   Stomach issues " undiagnosed stomach ulcer"      Medication List      albuterol 108 (90 Base) MCG/ACT inhaler Commonly known as: VENTOLIN HFA Inhale 2 puffs into the lungs every 6 (six) hours as needed.   Auvi-Q 0.3 mg/0.3 mL Soaj injection Generic drug: EPINEPHrine Use as directed for life-threatening allergic reaction.   azelastine 0.05 % ophthalmic solution Commonly known as: OPTIVAR Can use one drop in each eye twice daily if needed.   CENTRUM ADULTS PO Take 1 tablet by mouth daily.   cetirizine 10 MG tablet Commonly known as: ZYRTEC Take 1 tablet (10 mg total) by mouth daily.   fluticasone 50 MCG/ACT nasal spray Commonly known as: FLONASE Use one-two sprays in each nostril once daily as directed    Metadate CD 40 MG CR capsule Generic drug: methylphenidate Take 80 mg by mouth daily.   Qvar RediHaler 80 MCG/ACT inhaler Generic drug: beclomethasone INHALE 2 PUFFS BY MOUTH DAILY       Past Medical History:  Diagnosis Date  . Asthma   . Food allergy    Banana, snap peas, carrots, celery, apples    Past Surgical History:  Procedure Laterality Date  . APPENDECTOMY    . WISDOM TOOTH EXTRACTION      Review of systems negative except as noted in HPI / PMHx or noted below:  Review of Systems  Constitutional: Negative.   HENT: Negative.   Eyes: Negative.   Respiratory: Negative.   Cardiovascular: Negative.   Gastrointestinal: Negative.   Genitourinary: Negative.   Musculoskeletal: Negative.   Skin: Negative.   Neurological: Negative.   Endo/Heme/Allergies: Negative.   Psychiatric/Behavioral: Negative.      Objective:   Vitals:   01/10/20 1610  BP: 110/62  Pulse: 69  Resp: 16  Temp: 98 F (36.7 C)  SpO2: 98%   Height: 5\' 11"  (180.3 cm)  Weight: 147 lb 3.2 oz (66.8 kg)   Physical Exam Constitutional:      Appearance: He is not diaphoretic.  HENT:     Head: Normocephalic.     Right Ear: Tympanic membrane, ear canal and external ear normal.     Left Ear: Tympanic membrane, ear canal and external ear normal.  Nose: Nose normal. No mucosal edema or rhinorrhea.     Mouth/Throat:     Pharynx: Uvula midline. No oropharyngeal exudate.  Eyes:     Conjunctiva/sclera: Conjunctivae normal.  Neck:     Thyroid: No thyromegaly.     Trachea: Trachea normal. No tracheal tenderness or tracheal deviation.  Cardiovascular:     Rate and Rhythm: Normal rate and regular rhythm.     Heart sounds: Normal heart sounds, S1 normal and S2 normal. No murmur.  Pulmonary:     Effort: No respiratory distress.     Breath sounds: Normal breath sounds. No stridor. No wheezing or rales.  Lymphadenopathy:     Head:     Right side of head: No tonsillar adenopathy.     Left side  of head: No tonsillar adenopathy.     Cervical: No cervical adenopathy.  Skin:    Findings: No erythema or rash.     Nails: There is no clubbing.  Neurological:     Mental Status: He is alert.     Diagnostics:    Spirometry was performed and demonstrated an FEV1 of 5.10 at 113 % of predicted.  The patient had an Asthma Control Test with the following results: ACT Total Score: 24.    Assessment and Plan:   1. Asthma, well controlled, mild persistent   2. Other allergic rhinitis   3. Food allergy     1. Continue Qvar 80 redihaler one inhalation 1 time per day.   2. Continue nasal fluticasone 1-2 sprays each nostril one time per day  3. Continue ProAir HFA 2 puffs every 4-6 hours if needed  4. Continue Azelastine eyedrop one drop each eye twice a day if needed  5. Continue Zyrtec 10 mg one tablet one time per day if needed  6. Auvi-Q 3.0 epinephrine autoinjector if needed.    7. Can increase Qvar to 2 inhalations 2 times per day if 'flare up'  8. Return to clinic in 1 year or earlier if needed.    Laverna Peace appears to be doing quite well with minimal amounts of anti-inflammatory medications for both his upper and lower airway utilized on a consistent basis to prevent him from developing problems with his atopic respiratory disease.  Assuming he does well on this plan I will see him back in his clinic in 1 year or earlier if there is a problem.  Allena Katz, MD Allergy / Immunology [place]

## 2020-01-10 NOTE — Patient Instructions (Addendum)
  1. Continue Qvar 80 redihaler one inhalation 1 time per day.   2. Continue nasal fluticasone 1-2 sprays each nostril one time per day  3. Continue ProAir HFA 2 puffs every 4-6 hours if needed  4. Continue Azelastine eyedrop one drop each eye twice a day if needed  5. Continue Zyrtec 10 mg one tablet one time per day if needed  6. Auvi-Q 3.0 epinephrine autoinjector if needed.    7. Can increase Qvar to 2 inhalations 2 times per day if 'flare up'  8. Return to clinic in 1 year or earlier if needed.

## 2020-01-11 ENCOUNTER — Encounter: Payer: Self-pay | Admitting: Allergy and Immunology

## 2020-02-16 ENCOUNTER — Other Ambulatory Visit: Payer: Self-pay | Admitting: Allergy and Immunology

## 2021-01-08 ENCOUNTER — Ambulatory Visit: Payer: Self-pay | Admitting: Allergy and Immunology

## 2021-01-31 ENCOUNTER — Other Ambulatory Visit: Payer: Self-pay | Admitting: Allergy and Immunology

## 2021-02-19 ENCOUNTER — Encounter: Payer: Self-pay | Admitting: Allergy and Immunology

## 2021-02-19 ENCOUNTER — Other Ambulatory Visit: Payer: Self-pay

## 2021-02-19 ENCOUNTER — Ambulatory Visit (INDEPENDENT_AMBULATORY_CARE_PROVIDER_SITE_OTHER): Payer: BC Managed Care – PPO | Admitting: Allergy and Immunology

## 2021-02-19 VITALS — BP 118/80 | HR 74 | Temp 98.6°F | Resp 18 | Ht 71.0 in | Wt 150.0 lb

## 2021-02-19 DIAGNOSIS — Z91018 Allergy to other foods: Secondary | ICD-10-CM

## 2021-02-19 DIAGNOSIS — J4530 Mild persistent asthma, uncomplicated: Secondary | ICD-10-CM

## 2021-02-19 DIAGNOSIS — J3089 Other allergic rhinitis: Secondary | ICD-10-CM | POA: Diagnosis not present

## 2021-02-19 DIAGNOSIS — T781XXD Other adverse food reactions, not elsewhere classified, subsequent encounter: Secondary | ICD-10-CM

## 2021-02-19 MED ORDER — ALBUTEROL SULFATE HFA 108 (90 BASE) MCG/ACT IN AERS: INHALATION_SPRAY | RESPIRATORY_TRACT | 3 refills | Status: AC

## 2021-02-19 MED ORDER — FLUTICASONE PROPIONATE 50 MCG/ACT NA SUSP: NASAL | 5 refills | Status: DC

## 2021-02-19 MED ORDER — QVAR REDIHALER 80 MCG/ACT IN AERB: INHALATION_SPRAY | RESPIRATORY_TRACT | 1 refills | Status: DC

## 2021-02-19 MED ORDER — CETIRIZINE HCL 10 MG PO TABS: 10.0000 mg | ORAL_TABLET | Freq: Every day | ORAL | 5 refills | Status: DC

## 2021-02-19 MED ORDER — AZELASTINE HCL 0.05 % OP SOLN: OPHTHALMIC | 1 refills | Status: DC

## 2021-02-19 MED ORDER — AUVI-Q 0.3 MG/0.3ML IJ SOAJ: INTRAMUSCULAR | 1 refills | Status: DC

## 2021-02-19 NOTE — Progress Notes (Signed)
[place] - [place] - [place] - [place] - [place]   Follow-up Note  Referring Provider: Kathi Der, MD Primary Provider: Kathi Der, MD Date of Office Visit: 02/19/2021  Subjective:   John Cannon (DOB: [birth year]) is a 35 y.o. male who [facility]21 in re-evaluation of the following:  HPI: John Cannon returns to this clinic in reevaluation of asthma and allergic rhinitis and food allergy directed against fruits and vegetables.  His last visit to this clinic was 10 Jan 2020.  He has really doing very well regarding his asthma and has not required a systemic steroid to treat an exacerbation while he continues on Qvar on a pretty regular basis.  He can exercise without any problem and he rarely uses a short acting bronchodilator.  His nose is doing very well while using nasal fluticasone.  It does not sound as though he has required a antibiotic since summer 2021 at which point in time he apparently contracted a "cold" which was treated with an antibiotic.  Otherwise, he has been able to go through the springtime season without much difficulty.  He remains away from consumption of most raw fruits and vegetables.  He is now living in [place].  He has obtained 2 Pfizer COVID vaccines.  Allergies as of 02/19/2021       Reactions   Benadryl [diphenhydramine Hcl (sleep)]    Hyperactivity   Ceftin [cefuroxime] Itching   Doxycycline Other (See Comments)   Stomach issues " undiagnosed stomach ulcer"        Medication List     albuterol 108 (90 Base) MCG/ACT inhaler Commonly known as: VENTOLIN HFA TAKE 2 PUFFS BY MOUTH EVERY 6 HOURS AS NEEDED   Auvi-Q 0.3 mg/0.3 mL Soaj injection Generic drug: EPINEPHrine Use as directed for life-threatening allergic reaction.   azelastine 0.05 % ophthalmic solution Commonly known as: OPTIVAR Can use one drop in each eye twice daily if needed.   CENTRUM ADULTS PO Take 1 tablet  by mouth daily.   cetirizine 10 MG tablet Commonly known as: ZYRTEC Take 1 tablet (10 mg total) by mouth daily.   fluticasone 50 MCG/ACT nasal spray Commonly known as: FLONASE Use one-two sprays in each nostril once daily as directed   Metadate CD 40 MG CR capsule Generic drug: methylphenidate Take 80 mg by mouth daily.   Qvar RediHaler 80 MCG/ACT inhaler Generic drug: beclomethasone INHALE 2 PUFFS BY MOUTH EVERY DAY        Past Medical History:  Diagnosis Date   Asthma    Food allergy    Banana, snap peas, carrots, celery, apples    Past Surgical History:  Procedure Laterality Date   APPENDECTOMY     WISDOM TOOTH EXTRACTION      Review of systems negative except as noted in HPI / PMHx or noted below:  Review of Systems  Constitutional: Negative.   HENT: Negative.    Eyes: Negative.   Respiratory: Negative.    Cardiovascular: Negative.   Gastrointestinal: Negative.   Genitourinary: Negative.   Musculoskeletal: Negative.   Skin: Negative.   Neurological: Negative.   Endo/Heme/Allergies: Negative.   Psychiatric/Behavioral: Negative.      Objective:   Vitals:   02/19/21 1639  BP: 118/80  Pulse: 74  Resp: 18  Temp: 98.6 F (37 C)  SpO2: 96%   Height: 5\' 11"  (180.3 cm)  Weight: 150 lb (68 kg)   Physical Exam Constitutional:  Appearance: He is not diaphoretic.  HENT:     Head: Normocephalic.     Right Ear: Tympanic membrane, ear canal and external ear normal.     Left Ear: Tympanic membrane, ear canal and external ear normal.     Nose: Nose normal. No mucosal edema or rhinorrhea.     Mouth/Throat:     Pharynx: Uvula midline. No oropharyngeal exudate.  Eyes:     Conjunctiva/sclera: Conjunctivae normal.  Neck:     Thyroid: No thyromegaly.     Trachea: Trachea normal. No tracheal tenderness or tracheal deviation.  Cardiovascular:     Rate and Rhythm: Normal rate and regular rhythm.     Heart sounds: Normal heart sounds, S1 normal and S2  normal. No murmur heard. Pulmonary:     Effort: No respiratory distress.     Breath sounds: Normal breath sounds. No stridor. No wheezing or rales.  Lymphadenopathy:     Head:     Right side of head: No tonsillar adenopathy.     Left side of head: No tonsillar adenopathy.     Cervical: No cervical adenopathy.  Skin:    Findings: No erythema or rash.     Nails: There is no clubbing.  Neurological:     Mental Status: He is alert.    Diagnostics:    Spirometry was performed and demonstrated an FEV1 of 4.86114 at 11 % of predicted.  The patient had an Asthma Control Test with the following results: ACT Total Score: 24.    Assessment and Plan:   1. Asthma, well controlled, mild persistent   2. Other allergic rhinitis   3. Food allergy   4. Pollen-food allergy, subsequent encounter     1. Continue Qvar 80 redihaler one inhalation 1 time per day.   2. Continue nasal fluticasone 1-2 sprays each nostril one time per day  3. If Needed:  A. ProAir HFA 2 puffs every 4-6 hours B. Azelastine eyedrop one drop each eye twice a day  C. Zyrtec 10 mg one tablet one time per day D. Auvi-Q 3.0 epinephrine autoinjector  4. Can increase Qvar to 2 inhalations 2 times per day if 'flare up'  5. Return to clinic in 1 year or earlier if needed.    Overall John Cannon is doing very well on his current plan which includes relatively low doses of anti-inflammatory agents for both his upper and lower airway.  As long as he continues to do well with this plan I will see him back in this clinic in 1 year or earlier if there is a problem.  Laurette Schimke, MD Allergy / Immunology [facility]

## 2021-02-19 NOTE — Patient Instructions (Addendum)
  1. Continue Qvar 80 redihaler one inhalation 1 time per day.   2. Continue nasal fluticasone 1-2 sprays each nostril one time per day  3. If Needed:  A.ProAir HFA 2 puffs every 4-6 hours B. Azelastine eyedrop one drop each eye twice a day  C. Zyrtec 10 mg one tablet one time per day D. Auvi-Q 3.0 epinephrine autoinjector  4. Can increase Qvar to 2 inhalations 2 times per day if 'flare up'  5. Return to clinic in 1 year or earlier if needed.

## 2021-02-20 ENCOUNTER — Encounter: Payer: Self-pay | Admitting: Allergy and Immunology

## 2022-02-25 ENCOUNTER — Ambulatory Visit: Payer: BC Managed Care – PPO | Admitting: Allergy and Immunology

## 2022-03-31 ENCOUNTER — Ambulatory Visit: Payer: Managed Care, Other (non HMO) | Admitting: Allergy and Immunology

## 2022-03-31 ENCOUNTER — Encounter: Payer: Self-pay | Admitting: Allergy and Immunology

## 2022-03-31 VITALS — BP 112/80 | HR 72 | Resp 16 | Ht 70.5 in | Wt 150.0 lb

## 2022-03-31 DIAGNOSIS — J3089 Other allergic rhinitis: Secondary | ICD-10-CM

## 2022-03-31 DIAGNOSIS — J4530 Mild persistent asthma, uncomplicated: Secondary | ICD-10-CM | POA: Diagnosis not present

## 2022-03-31 DIAGNOSIS — T781XXD Other adverse food reactions, not elsewhere classified, subsequent encounter: Secondary | ICD-10-CM

## 2022-03-31 MED ORDER — AUVI-Q 0.3 MG/0.3ML IJ SOAJ: INTRAMUSCULAR | 1 refills | Status: DC

## 2022-03-31 MED ORDER — QVAR REDIHALER 80 MCG/ACT IN AERB: INHALATION_SPRAY | RESPIRATORY_TRACT | 11 refills | Status: DC

## 2022-03-31 NOTE — Patient Instructions (Signed)
  1. Continue Qvar 80 redihaler one inhalation 1 time per day.   2. Continue nasal fluticasone 1-2 sprays each nostril 1-2 times per day  3. If Needed:  A.ProAir HFA 2 puffs every 4-6 hours B. Azelastine eyedrop one drop each eye twice a day  C. Zyrtec 10 mg one tablet one time per day D. Auvi-Q 3.0 epinephrine autoinjector  4. Can increase Qvar to 2 inhalations 2 times per day if 'flare up'  5. Return to clinic in 1 year or earlier if needed.    6. Obtain fall flu vaccine

## 2022-03-31 NOTE — Progress Notes (Unsigned)
[place] - [place] - [place] - [place] - [place]   Follow-up Note  Referring Provider: No ref. provider found Primary Provider: Kathi Der, MD (Inactive) Date of Office Visit: 03/31/2022  Subjective:   John Cannon (DOB: 1985-10-22) is a 35 y.o. male who [facility] on 03/31/2022 in re-evaluation of the following:  HPI: John Cannon returns to this clinic in evaluation of asthma, allergic rhinitis, and food allergy directed against fruits and vegetables.  His last visit to this clinic was 6 19 February 2021.  His asthma has not been interpretively big issue and he has not required a systemic steroid to treat an exacerbation and rarely uses a short acting bronchodilator and can exercise without any problem while he continues on Qvar mostly at a dose of 80 mcg 1 time per day.  Likewise has had very little problems with his upper airway while using a nasal steroid on a pretty consistent basis and he has not required an antibiotic to treat an episode of sinusitis.  He remains away from consumption of raw fruits and vegetables.  Allergies as of 03/31/2022       Reactions   Benadryl [diphenhydramine Hcl (sleep)]    Hyperactivity   Ceftin [cefuroxime] Itching   Doxycycline Other (See Comments)   Stomach issues " undiagnosed stomach ulcer"        Medication List    albuterol 108 (90 Base) MCG/ACT inhaler Commonly known as: VENTOLIN HFA TAKE 2 PUFFS BY MOUTH EVERY 6 HOURS AS NEEDED   Auvi-Q 0.3 mg/0.3 mL Soaj injection Generic drug: EPINEPHrine Use as directed for life-threatening allergic reaction.   azelastine 0.05 % ophthalmic solution Commonly known as: OPTIVAR Can use one drop in each eye twice daily if needed.   CENTRUM ADULTS PO Take 1 tablet by mouth daily.   cetirizine 10 MG tablet Commonly known as: ZYRTEC Take 1 tablet (10 mg total) by mouth daily.   fluticasone 50 MCG/ACT nasal spray Commonly known as: FLONASE Use  one-two sprays in each nostril once daily as directed   Metadate CD 40 MG CR capsule Generic drug: methylphenidate Take 80 mg by mouth daily.   Qvar RediHaler 80 MCG/ACT inhaler Generic drug: beclomethasone INHALE 2 PUFFS BY MOUTH EVERY DAY    Past Medical History:  Diagnosis Date   Asthma    Food allergy    Banana, snap peas, carrots, celery, apples    Past Surgical History:  Procedure Laterality Date   APPENDECTOMY     WISDOM TOOTH EXTRACTION      Review of systems negative except as noted in HPI / PMHx or noted below:  Review of Systems  Constitutional: Negative.   HENT: Negative.    Eyes: Negative.   Respiratory: Negative.    Cardiovascular: Negative.   Gastrointestinal: Negative.   Genitourinary: Negative.   Musculoskeletal: Negative.   Skin: Negative.   Neurological: Negative.   Endo/Heme/Allergies: Negative.   Psychiatric/Behavioral: Negative.       Objective:   Vitals:   03/31/22 1132  BP: 112/80  Pulse: 72  Resp: 16  SpO2: 98%   Height: 5' 10.5" (179.1 cm)  Weight: 150 lb (68 kg)   Physical Exam Constitutional:      Appearance: He is not diaphoretic.  HENT:     Head: Normocephalic.     Right Ear: Tympanic membrane, ear canal and external ear normal.     Left Ear: Tympanic membrane, ear canal and external ear normal.  Nose: Nose normal. No mucosal edema or rhinorrhea.     Mouth/Throat:     Pharynx: Uvula midline. No oropharyngeal exudate.  Eyes:     Conjunctiva/sclera: Conjunctivae normal.  Neck:     Thyroid: No thyromegaly.     Trachea: Trachea normal. No tracheal tenderness or tracheal deviation.  Cardiovascular:     Rate and Rhythm: Normal rate and regular rhythm.     Heart sounds: Normal heart sounds, S1 normal and S2 normal. No murmur heard. Pulmonary:     Effort: No respiratory distress.     Breath sounds: Normal breath sounds. No stridor. No wheezing or rales.  Lymphadenopathy:     Head:     Right side of head: No  tonsillar adenopathy.     Left side of head: No tonsillar adenopathy.     Cervical: No cervical adenopathy.  Skin:    Findings: No erythema or rash.     Nails: There is no clubbing.  Neurological:     Mental Status: He is alert.     Diagnostics:    Spirometry was performed and demonstrated an FEV1 of 4.47 at 99 % of predicted.  Assessment and Plan:   1. Asthma, well controlled, mild persistent   2. Other allergic rhinitis   3. Pollen-food allergy, subsequent encounter     1. Continue Qvar 80 redihaler one inhalation 1 time per day.   2. Continue nasal fluticasone 1-2 sprays each nostril 1-2 times per day  3. If Needed:  A.ProAir HFA 2 puffs every 4-6 hours B. Azelastine eyedrop one drop each eye twice a day  C. Zyrtec 10 mg one tablet one time per day D. Auvi-Q 3.0 epinephrine autoinjector  4. Can increase Qvar to 2 inhalations 2 times per day if 'flare up'  5. Return to clinic in 1 year or earlier if needed.    6. Obtain fall flu vaccine  John Cannon appears to be doing very well and he has a very good idea about his disease state and how his medications work and appropriate dosing of his medications depending on disease activity.  See me continues to do well with the plan noted above I will see him back in this clinic in 1 year or earlier if there is a problem.  Laurette Schimke, MD Allergy / Immunology [facility]

## 2022-04-01 ENCOUNTER — Encounter: Payer: Self-pay | Admitting: Allergy and Immunology

## 2023-05-04 ENCOUNTER — Telehealth: Payer: Self-pay | Admitting: Allergy and Immunology

## 2023-05-04 MED ORDER — QVAR REDIHALER 80 MCG/ACT IN AERB: INHALATION_SPRAY | RESPIRATORY_TRACT | 0 refills | Status: AC

## 2023-05-04 NOTE — Telephone Encounter (Signed)
Sent in 1 curtesy refill of qvar for pt to [facility] [address] [place]

## 2023-05-04 NOTE — Telephone Encounter (Signed)
Patient is requesting 30 day courtesy refill for qvar. Patient scheduled visit with Dr. Lucie Leather for October 2024 when he will be in town.   [facility] - [address] [place] [address]  Patient requesting call back to confirm/deny refill, [phone]

## 2023-05-06 NOTE — Telephone Encounter (Signed)
Patient called and stated his insurance won't refill the medication for 90 day supply and wants to know if the medication can be resent for 30 day supply.

## 2023-05-06 NOTE — Addendum Note (Signed)
Addended by: Maryjean Morn D on: 05/06/2023 05:15 PM   Modules accepted: Orders

## 2023-07-07 ENCOUNTER — Other Ambulatory Visit: Payer: Self-pay

## 2023-07-07 ENCOUNTER — Ambulatory Visit: Payer: Managed Care, Other (non HMO) | Admitting: Allergy and Immunology

## 2023-07-07 ENCOUNTER — Encounter: Payer: Self-pay | Admitting: Allergy and Immunology

## 2023-07-07 VITALS — BP 118/70 | HR 81 | Temp 98.1°F | Ht 71.0 in | Wt 155.2 lb

## 2023-07-07 DIAGNOSIS — U099 Post covid-19 condition, unspecified: Secondary | ICD-10-CM | POA: Diagnosis not present

## 2023-07-07 DIAGNOSIS — J3089 Other allergic rhinitis: Secondary | ICD-10-CM

## 2023-07-07 DIAGNOSIS — T7819XD Other adverse food reactions, not elsewhere classified, subsequent encounter: Secondary | ICD-10-CM

## 2023-07-07 DIAGNOSIS — J4530 Mild persistent asthma, uncomplicated: Secondary | ICD-10-CM | POA: Diagnosis not present

## 2023-07-07 DIAGNOSIS — T781XXD Other adverse food reactions, not elsewhere classified, subsequent encounter: Secondary | ICD-10-CM

## 2023-07-07 MED ORDER — CETIRIZINE HCL 10 MG PO TABS: 10.0000 mg | ORAL_TABLET | Freq: Every day | ORAL | 3 refills | Status: DC | PRN

## 2023-07-07 MED ORDER — AUVI-Q 0.3 MG/0.3ML IJ SOAJ: INTRAMUSCULAR | 1 refills | Status: DC

## 2023-07-07 MED ORDER — FLUTICASONE PROPIONATE 50 MCG/ACT NA SUSP: 2.0000 | Freq: Two times a day (BID) | NASAL | 3 refills | Status: AC

## 2023-07-07 MED ORDER — QVAR REDIHALER 80 MCG/ACT IN AERB: 2.0000 | INHALATION_SPRAY | Freq: Two times a day (BID) | RESPIRATORY_TRACT | 3 refills | Status: AC

## 2023-07-07 MED ORDER — METHYLPREDNISOLONE ACETATE 80 MG/ML IJ SUSP
80.0000 mg | Freq: Once | INTRAMUSCULAR | Status: AC
  Administered 2023-07-07: 80 mg via INTRAMUSCULAR

## 2023-07-07 MED ORDER — AIRSUPRA 90-80 MCG/ACT IN AERO: 2.0000 | INHALATION_SPRAY | RESPIRATORY_TRACT | 1 refills | Status: DC | PRN

## 2023-07-07 MED ORDER — AZELASTINE HCL 0.05 % OP SOLN: 1.0000 [drp] | Freq: Two times a day (BID) | OPHTHALMIC | 3 refills | Status: DC

## 2023-07-07 NOTE — Progress Notes (Unsigned)
[place] - [place] - [place] - [place] - [place]   Follow-up Note  Referring Provider: No ref. provider found Primary Provider: Kathi Der, MD (Inactive) Date of Office Visit: 07/07/2023  Subjective:   John Cannon (DOB: [birth year]) is a 37 y.o. male who [facility] in re-evaluation of the following:  HPI: John Cannon returns to this clinic in evaluation of asthma, allergic rhinitis, food allergy directed against fruits/vegetables.  I last saw him in this clinic 31 March 2022.  He has really done well with his asthma since his last visit and he has not required a systemic steroid to treat an exacerbation and he rarely uses a short acting bronchodilator and he can exercise with any difficulty while he continues on Qvar relatively low dose at 80 mcg/day.  And has had very little problems with his upper airways as well while using nasal fluticasone and some other medications on occasion.  He has not required an antibiotic treat an episode of sinusitis.  However, Labor Day 2024 he contracted COVID and he took Paxlovid but ever since then he has had some runny nose and some intermittent nasal congestion and lots of throat clearing and drainage in his throat without any significant coughing or shortness of breath or chest tightness or wheezing or sputum production or ugly nasal discharge or headache or fever.  He does not consume uncooked fruits and vegetables.  He has received the flu vaccine this year.  Allergies as of 07/07/2023       Reactions   Benadryl [diphenhydramine Hcl (sleep)]    Hyperactivity   Ceftin [cefuroxime] Itching   Doxycycline Other (See Comments)   Stomach issues " undiagnosed stomach ulcer"        Medication List    albuterol 108 (90 Base) MCG/ACT inhaler Commonly known as: VENTOLIN HFA TAKE 2 PUFFS BY MOUTH EVERY 6 HOURS AS NEEDED   Auvi-Q 0.3 MG/0.3ML Soaj injection Generic drug: EPINEPHrine Use  as directed for life-threatening allergic reaction.   azelastine 0.05 % ophthalmic solution Commonly known as: OPTIVAR Can use one drop in each eye twice daily if needed.   CENTRUM ADULTS PO Take 1 tablet by mouth daily.   cetirizine 10 MG tablet Commonly known as: ZYRTEC Take 1 tablet (10 mg total) by mouth daily.   fluticasone 50 MCG/ACT nasal spray Commonly known as: FLONASE Use one-two sprays in each nostril once daily as directed   Metadate CD 40 MG CR capsule Generic drug: methylphenidate Take 80 mg by mouth daily.   Qvar RediHaler 80 MCG/ACT inhaler Generic drug: beclomethasone Inhale one puff once daily to prevent cough or wheeze. Use two puffs twice daily during flare-up.  Rinse, gargle, and spit after use.    Past Medical History:  Diagnosis Date   Asthma    Food allergy    Banana, snap peas, carrots, celery, apples    Past Surgical History:  Procedure Laterality Date   APPENDECTOMY     WISDOM TOOTH EXTRACTION      Review of systems negative except as noted in HPI / PMHx or noted below:  Review of Systems  Constitutional: Negative.   HENT: Negative.    Eyes: Negative.   Respiratory: Negative.    Cardiovascular: Negative.   Gastrointestinal: Negative.   Genitourinary: Negative.   Musculoskeletal: Negative.   Skin: Negative.   Neurological: Negative.   Endo/Heme/Allergies: Negative.   Psychiatric/Behavioral: Negative.       Objective:  Vitals:   07/07/23 1358  BP: 118/70  Pulse: 81  Temp: 98.1 F (36.7 C)  SpO2: 97%   Height: 5\' 11"  (180.3 cm)  Weight: 155 lb 3.2 oz (70.4 kg)   Physical Exam Constitutional:      Appearance: He is not diaphoretic.  HENT:     Head: Normocephalic.     Right Ear: Tympanic membrane, ear canal and external ear normal.     Left Ear: Tympanic membrane, ear canal and external ear normal.     Nose: Nose normal. No mucosal edema or rhinorrhea.     Mouth/Throat:     Pharynx: Uvula midline. No oropharyngeal  exudate.  Eyes:     Conjunctiva/sclera: Conjunctivae normal.  Neck:     Thyroid: No thyromegaly.     Trachea: Trachea normal. No tracheal tenderness or tracheal deviation.  Cardiovascular:     Rate and Rhythm: Normal rate and regular rhythm.     Heart sounds: Normal heart sounds, S1 normal and S2 normal. No murmur heard. Pulmonary:     Effort: No respiratory distress.     Breath sounds: Normal breath sounds. No stridor. No wheezing or rales.  Lymphadenopathy:     Head:     Right side of head: No tonsillar adenopathy.     Left side of head: No tonsillar adenopathy.     Cervical: No cervical adenopathy.  Skin:    Findings: No erythema or rash.     Nails: There is no clubbing.  Neurological:     Mental Status: He is alert.     Diagnostics: Spirometry was performed and demonstrated an FEV1 of 4.53 at 115 % of predicted.  Assessment and Plan:   1. Asthma, well controlled, mild persistent   2. Other allergic rhinitis   3. Pollen-food allergy, subsequent encounter   4. Post-COVID syndrome    1. Continue Qvar 80 redihaler one inhalation 1-2 time per day.   2. Continue nasal fluticasone 1-2 sprays each nostril 1-2 times per day  3. If Needed:  A. AIRSUPRA -2 puffs every 4-6 hours (Coupon) B. Azelastine eyedrop one drop each eye twice a day  C. Zyrtec 10 mg one tablet one time per day D. Auvi-Q 3.0 epinephrine autoinjector  4. For this recent event:   A. Depomedrol 80 mg IM delivered in clinic today  4. Return to clinic in 1 year or earlier if needed.    John Cannon appears to have some lingering inflammation in his respiratory tract from his recent COVID infection then we will treat him with an anti-inflammatory medicine in the form of a systemic steroid today.  Overall he has really done well other than this infection and he will continue on a low-dose of inhaled steroids and nasal steroids and I have given him a combination anti-inflammatory rescue medicine to use should it be  required.  Assuming he does well with this plan I will see him back in this clinic in 1 year or earlier if there is a problem.  Laurette Schimke, MD Allergy / Immunology [facility]

## 2023-07-07 NOTE — Patient Instructions (Addendum)
  1. Continue Qvar 80 redihaler one inhalation 1-2 time per day.   2. Continue nasal fluticasone 1-2 sprays each nostril 1-2 times per day  3. If Needed:  A. AIRSUPRA -2 puffs every 4-6 hours (Coupon) B. Azelastine eyedrop one drop each eye twice a day  C. Zyrtec 10 mg one tablet one time per day D. Auvi-Q 3.0 epinephrine autoinjector  4. For this recent event:   A. Depomedrol 80 mg IM delivered in clinic today  4. Return to clinic in 1 year or earlier if needed.

## 2023-07-08 ENCOUNTER — Encounter: Payer: Self-pay | Admitting: Allergy and Immunology

## 2024-07-05 ENCOUNTER — Ambulatory Visit: Payer: Managed Care, Other (non HMO) | Admitting: Allergy and Immunology

## 2024-07-05 ENCOUNTER — Telehealth: Payer: Self-pay

## 2024-07-05 ENCOUNTER — Other Ambulatory Visit: Payer: Self-pay

## 2024-07-05 ENCOUNTER — Encounter: Payer: Self-pay | Admitting: Allergy and Immunology

## 2024-07-05 VITALS — BP 128/88 | HR 88 | Temp 98.7°F | Resp 16 | Ht 71.0 in | Wt 163.8 lb

## 2024-07-05 DIAGNOSIS — J4530 Mild persistent asthma, uncomplicated: Secondary | ICD-10-CM

## 2024-07-05 DIAGNOSIS — J3089 Other allergic rhinitis: Secondary | ICD-10-CM

## 2024-07-05 DIAGNOSIS — T7819XD Other adverse food reactions, not elsewhere classified, subsequent encounter: Secondary | ICD-10-CM

## 2024-07-05 MED ORDER — AZELASTINE HCL 0.05 % OP SOLN: 1.0000 [drp] | Freq: Two times a day (BID) | OPHTHALMIC | 5 refills | Status: AC

## 2024-07-05 MED ORDER — AIRSUPRA 90-80 MCG/ACT IN AERO: 2.0000 | INHALATION_SPRAY | RESPIRATORY_TRACT | 5 refills | Status: AC | PRN

## 2024-07-05 MED ORDER — CETIRIZINE HCL 10 MG PO TABS: 10.0000 mg | ORAL_TABLET | Freq: Every day | ORAL | 3 refills | Status: AC | PRN

## 2024-07-05 MED ORDER — NEFFY 2 MG/0.1ML NA SOLN: 1.0000 | NASAL | 1 refills | Status: DC | PRN

## 2024-07-05 NOTE — Telephone Encounter (Signed)
*  [facility]  Pharmacy Patient Advocate Encounter   Received notification from CoverMyMeds that prior authorization for Neffy  2MG /0.1ML solution  is required/requested.   Insurance verification completed.   The patient is insured through [employer] **.   Per test claim:  Injectable Epinephrine  is preferred by the insurance.  If suggested medication is appropriate, Please send in a new RX and discontinue this one. If not, please advise as to why it's not appropriate so that we may request a Prior Authorization. Please note, some preferred medications may still require a PA.  If the suggested medications have not been trialed and there are no contraindications to their use, the PA will not be submitted, as it will not be approved.   CMM Key: [id number]

## 2024-07-05 NOTE — Patient Instructions (Addendum)
  1. Continue Qvar  80 redihaler 1-2 inhalations 1-2 time per day.   2. Continue nasal fluticasone  1-2 sprays each nostril 1-2 times per day  3. If Needed:  A. AIRSUPRA  -2 puffs every 4-6 hours  B. Azelastine  eyedrop one drop each eye twice a day  C. Zyrtec  10 mg one tablet one time per day D. Neffy , benadryl, (MD/ER evaluation) for allergic reaction   4. Influenza = Tamiflu. Covid = Paxlovid  5. Return to clinic in 1 year or earlier if needed.

## 2024-07-05 NOTE — Progress Notes (Unsigned)
 [place] - [place] - [place] - [place] - [place]   Follow-up Note  Referring Provider: No ref. provider found Primary Provider: Siddiqui, Adeel M, MD (Inactive) Date of Office Visit: 07/05/2024  Subjective:   John Cannon (DOB: 1986/01/28) is a 38 y.o. 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  I last saw him in this clinic 07 July 2023.  He has really done very well with his asthma.  He did have 1 exacerbation which appeared to be secondary to a viral respiratory tract infection somewhere around September 2025 for which he had to use his Airsupra  for about a week or so to deal with his coughing and congestion in his chest.  Other than that event he has really done well and has not required a systemic steroid to treat an exacerbation in can exercise without a problem while he continues to use his Qvar  relatively low dose usually just 1 inhalation once a day.  And he had very little problem with his upper airway while using a nasal steroid.  He has not required an antibiotic to treat an episode of sinusitis.  He continues to remain away from consumption of uncooked fruits and vegetables.  Allergies as of 07/05/2024       Reactions   Benadryl [diphenhydramine Hcl (sleep)]    Hyperactivity   Ceftin [cefuroxime] Itching   Doxycycline Other (See Comments)   Stomach issues  undiagnosed stomach ulcer        Medication List    Airsupra  90-80 MCG/ACT Aero Generic drug: Albuterol -Budesonide  Inhale 2 Inhalations into the lungs every 4 (four) hours as needed.   albuterol  108 (90 Base) MCG/ACT inhaler Commonly known as: VENTOLIN  HFA TAKE 2 PUFFS BY MOUTH EVERY 6 HOURS AS NEEDED   Auvi-Q  0.3 MG/0.3ML Soaj injection Generic drug:  EPINEPHrine  Use as directed for life-threatening allergic reaction.   azelastine  0.05 % ophthalmic solution Commonly known as: OPTIVAR  Place 1 drop into both eyes 2 (two) times daily. Can use one drop in each eye twice daily if needed.   CENTRUM ADULTS PO Take 1 tablet by mouth daily.   cetirizine  10 MG tablet Commonly known as: ZYRTEC  Take 1 tablet (10 mg total) by mouth daily as needed for allergies (Can take an extra dose during flare ups.).   fluticasone  50 MCG/ACT nasal spray Commonly known as: FLONASE  Place 2 sprays into both nostrils in the morning and at bedtime. Use one-two sprays in each nostril once daily as directed   Metadate CD 40 MG CR capsule Generic drug: methylphenidate Take 80 mg by mouth daily.   Qvar  RediHaler 80 MCG/ACT inhaler Generic drug: beclomethasone Inhale one puff once daily to prevent cough or wheeze. Use two puffs twice daily during flare-up.  Rinse, gargle, and spit after use.   Qvar  RediHaler 80 MCG/ACT inhaler Generic drug: beclomethasone Inhale 2 puffs into the lungs 2 (two) times daily.    Past Medical History:  Diagnosis Date   Asthma    Food allergy    Banana, snap peas, carrots, celery, apples    Past Surgical History:  Procedure Laterality Date   APPENDECTOMY     WISDOM TOOTH EXTRACTION      Review of systems negative except as noted in HPI / PMHx or noted below:  Review  of Systems  Constitutional: Negative.   HENT: Negative.    Eyes: Negative.   Respiratory: Negative.    Cardiovascular: Negative.   Gastrointestinal: Negative.   Genitourinary: Negative.   Musculoskeletal: Negative.   Skin: Negative.   Neurological: Negative.   Endo/Heme/Allergies: Negative.   Psychiatric/Behavioral: Negative.       Objective:   Vitals:   07/05/24 0925  BP: 128/88  Pulse: 88  Resp: 16  Temp: 98.7 F (37.1 C)  SpO2: 97%   Height: 5' 11 (180.3 cm)  Weight: 163 lb 12.8 oz (74.3 kg)   Physical Exam Constitutional:       Appearance: He is not diaphoretic.  HENT:     Head: Normocephalic.     Right Ear: Tympanic membrane, ear canal and external ear normal.     Left Ear: Tympanic membrane, ear canal and external ear normal.     Nose: Nose normal. No mucosal edema or rhinorrhea.     Mouth/Throat:     Pharynx: Uvula midline. No oropharyngeal exudate.  Eyes:     Conjunctiva/sclera: Conjunctivae normal.  Neck:     Thyroid: No thyromegaly.     Trachea: Trachea normal. No tracheal tenderness or tracheal deviation.  Cardiovascular:     Rate and Rhythm: Normal rate and regular rhythm.     Heart sounds: Normal heart sounds, S1 normal and S2 normal. No murmur heard. Pulmonary:     Effort: No respiratory distress.     Breath sounds: Normal breath sounds. No stridor. No wheezing or rales.  Lymphadenopathy:     Head:     Right side of head: No tonsillar adenopathy.     Left side of head: No tonsillar adenopathy.     Cervical: No cervical adenopathy.  Skin:    Findings: No erythema or rash.     Nails: There is no clubbing.  Neurological:     Mental Status: He is alert.     Diagnostics: Spirometry was performed and demonstrated an FEV1 of 4.76 at 107 % of predicted.  Assessment and Plan:   1. Asthma, well controlled, mild persistent   2. Other allergic rhinitis   3. Pollen-food allergy, subsequent encounter    1. Continue Qvar  80 redihaler 1-2 inhalations 1-2 time per day.   2. Continue nasal fluticasone  1-2 sprays each nostril 1-2 times per day  3. If Needed:  A. AIRSUPRA  -2 puffs every 4-6 hours  B. Azelastine  eyedrop one drop each eye twice a day  C. Zyrtec  10 mg one tablet one time per day D. Neffy , benadryl, (MD/ER evaluation) for allergic reaction   4. Influenza = Tamiflu. Covid = Paxlovid  5. Return to clinic in 1 year or earlier if needed.    John Cannon appears to be doing very well with his multiorgan atopic disease and he will continue on anti-inflammatory agents at a relatively low dose for  both his upper and lower airway.  We have given him intranasal epinephrine  to be utilized should he develop an allergic reaction with inadvertent consumption of uncooked fruits and vegetables.  Assuming he does well with this plan we will see him back in this clinic in 1 year or earlier if there is a problem.  Camellia Denis, MD Allergy / Immunology [facility]

## 2024-07-06 ENCOUNTER — Encounter: Payer: Self-pay | Admitting: Allergy and Immunology

## 2024-07-11 NOTE — Telephone Encounter (Signed)
 I called the patient to inform. I left a message to callback. Will need to verify pharmacy to send IM epinephrine .

## 2024-07-12 NOTE — Telephone Encounter (Signed)
 Patient called back to return voicemail that was left on his cell. Verified with patient the pharmacy to send the Epinephrine . He stated it is the [facility] on [address] in [place].

## 2024-07-13 MED ORDER — EPINEPHRINE 0.3 MG/0.3ML IJ SOAJ: 0.3000 mg | INTRAMUSCULAR | 2 refills | Status: AC | PRN

## 2024-07-13 NOTE — Addendum Note (Signed)
 Addended by: Conner Muegge E on: 07/13/2024 01:58 PM   Modules accepted: Orders

## 2025-07-04 ENCOUNTER — Ambulatory Visit: Admitting: Allergy and Immunology
